# Patient Record
Sex: Male | Born: 1970 | Race: Black or African American | Hispanic: No | Marital: Married | Smoking: Never smoker
Health system: Southern US, Community
[De-identification: ages and names within clinical notes are randomized; demographics above are authoritative.]

## PROBLEM LIST (undated history)

## (undated) DIAGNOSIS — IMO0001 Reserved for inherently not codable concepts without codable children: Secondary | ICD-10-CM

## (undated) HISTORY — DX: Reserved for inherently not codable concepts without codable children: IMO0001

## (undated) HISTORY — PX: OTHER SURGICAL HISTORY: SHX169

## (undated) HISTORY — PX: BACK SURGERY: SHX140

---

## 2007-06-28 ENCOUNTER — Other Ambulatory Visit: Admission: RE | Admit: 2007-06-28 | Discharge: 2007-06-28 | Payer: Self-pay | Admitting: Otolaryngology

## 2007-07-08 ENCOUNTER — Encounter: Admission: RE | Admit: 2007-07-08 | Discharge: 2007-07-08 | Payer: Self-pay | Admitting: Otolaryngology

## 2007-08-01 ENCOUNTER — Other Ambulatory Visit: Admission: RE | Admit: 2007-08-01 | Discharge: 2007-08-01 | Payer: Self-pay | Admitting: Otolaryngology

## 2007-08-30 ENCOUNTER — Encounter: Payer: Self-pay | Admitting: Infectious Disease

## 2007-09-24 ENCOUNTER — Ambulatory Visit: Payer: Self-pay | Admitting: Infectious Diseases

## 2007-09-24 ENCOUNTER — Encounter: Payer: Self-pay | Admitting: Infectious Disease

## 2007-09-24 DIAGNOSIS — R599 Enlarged lymph nodes, unspecified: Secondary | ICD-10-CM | POA: Insufficient documentation

## 2007-09-24 LAB — CONVERTED CEMR LAB
Basophils Absolute: 0 10*3/uL (ref 0.0–0.1)
Eosinophils Relative: 1 % (ref 0–5)
HCT: 43.9 % (ref 39.0–52.0)
Hemoglobin: 14.6 g/dL (ref 13.0–17.0)
Lymphocytes Relative: 47 % — ABNORMAL HIGH (ref 12–46)
Lymphs Abs: 2 10*3/uL (ref 0.7–4.0)
Monocytes Absolute: 0.3 10*3/uL (ref 0.1–1.0)
Neutro Abs: 1.9 10*3/uL (ref 1.7–7.7)
Platelets: 237 10*3/uL (ref 150–400)
RDW: 12.4 % (ref 11.5–15.5)
Sed Rate: 9 mm/hr (ref 0–16)
WBC: 4.3 10*3/uL (ref 4.0–10.5)

## 2014-04-06 ENCOUNTER — Encounter (HOSPITAL_BASED_OUTPATIENT_CLINIC_OR_DEPARTMENT_OTHER): Payer: Self-pay | Admitting: Emergency Medicine

## 2014-04-06 ENCOUNTER — Emergency Department (HOSPITAL_BASED_OUTPATIENT_CLINIC_OR_DEPARTMENT_OTHER)
Admission: EM | Admit: 2014-04-06 | Discharge: 2014-04-07 | Disposition: A | Discharge status: Home / Self Care | Payer: Managed Care, Other (non HMO) | Attending: Emergency Medicine | Admitting: Emergency Medicine

## 2014-04-06 ENCOUNTER — Emergency Department (HOSPITAL_BASED_OUTPATIENT_CLINIC_OR_DEPARTMENT_OTHER): Payer: Managed Care, Other (non HMO)

## 2014-04-06 DIAGNOSIS — M25519 Pain in unspecified shoulder: Secondary | ICD-10-CM | POA: Insufficient documentation

## 2014-04-06 DIAGNOSIS — R079 Chest pain, unspecified: Secondary | ICD-10-CM | POA: Insufficient documentation

## 2014-04-06 LAB — TROPONIN I

## 2014-04-06 LAB — CBC
HEMATOCRIT: 41.9 % (ref 39.0–52.0)
HEMOGLOBIN: 14.6 g/dL (ref 13.0–17.0)
MCH: 30.9 pg (ref 26.0–34.0)
MCHC: 34.8 g/dL (ref 30.0–36.0)
MCV: 88.8 fL (ref 78.0–100.0)
Platelets: 201 10*3/uL (ref 150–400)
RBC: 4.72 MIL/uL (ref 4.22–5.81)
RDW: 11.8 % (ref 11.5–15.5)
WBC: 5.3 10*3/uL (ref 4.0–10.5)

## 2014-04-06 LAB — PROTIME-INR
INR: 1.04 (ref 0.00–1.49)
PROTHROMBIN TIME: 13.6 s (ref 11.6–15.2)

## 2014-04-06 LAB — COMPREHENSIVE METABOLIC PANEL
ALT: 24 U/L (ref 0–53)
AST: 26 U/L (ref 0–37)
Albumin: 4.3 g/dL (ref 3.5–5.2)
Alkaline Phosphatase: 81 U/L (ref 39–117)
BUN: 9 mg/dL (ref 6–23)
CALCIUM: 9.5 mg/dL (ref 8.4–10.5)
CO2: 26 mEq/L (ref 19–32)
CREATININE: 1.2 mg/dL (ref 0.50–1.35)
Chloride: 101 mEq/L (ref 96–112)
GFR calc non Af Amer: 74 mL/min — ABNORMAL LOW (ref >90)
GFR, EST AFRICAN AMERICAN: 85 mL/min — AB (ref >90)
GLUCOSE: 97 mg/dL (ref 70–99)
Potassium: 3.8 mEq/L (ref 3.7–5.3)
Sodium: 140 mEq/L (ref 137–147)
TOTAL PROTEIN: 7.8 g/dL (ref 6.0–8.3)
Total Bilirubin: 0.6 mg/dL (ref 0.3–1.2)

## 2014-04-06 LAB — APTT: aPTT: 28 seconds (ref 24–37)

## 2014-04-06 MED ORDER — ASPIRIN 81 MG PO CHEW
324.0000 mg | CHEWABLE_TABLET | Freq: Once | ORAL | Status: AC
  Administered 2014-04-06: 324 mg via ORAL
  Filled 2014-04-06: qty 4

## 2014-04-06 MED ORDER — SODIUM CHLORIDE 0.9 % IV SOLN
1000.0000 mL | INTRAVENOUS | Status: DC
  Administered 2014-04-06: 1000 mL via INTRAVENOUS

## 2014-04-06 NOTE — ED Notes (Signed)
Pt c/o left shoulder and lefty chest pain  X 1 hr

## 2014-04-06 NOTE — ED Notes (Signed)
Troponin sent to lab. Pt condition remains stable currently denies pain and is NSR on monitor.

## 2014-04-06 NOTE — ED Provider Notes (Signed)
CSN: [id number]     Arrival date & time 04/06/14  2010 History  This chart was scribed for Linwood DibblesJon Ayyub Krall, MD by Shari HeritageAisha Amuda, ED Scribe. The patient was seen in room MH02/MH02. Patient's care was started at 8:20 PM.   Chief Complaint  Patient presents with  . Chest Pain    The history is provided by the patient. No language interpreter was used.    HPI Comments: Laurence SlateJoe Seawright is a 43 y.o. male who presents to the Emergency Department complaining of an episode moderate, left anterior chest and left shoulder pain that began 1 hour ago and lasted about 20 minutes. Patient states that pain began while he was at rest at home. Pain does not radiate through to his back. Her reports that he lied down for several minutes and pain resolved spontaneously. He and his family came to the ED immediately following this for evaluation of this pain. Currently, he denies any discomfort. He states that he has been under a lot of stress recently after starting a new job. He denies shortness of breath, diaphoresis, vomiting, leg swelling, nausea, headaches, or any other symptoms at this time. He has no history of MI or cardiac disease and has no chronic medical conditions. He denies family history of cardiac disease. He states that he exercises 3-4 days per week. Patient does not smoke.  History reviewed. No pertinent past medical history. History reviewed. No pertinent past surgical history. History reviewed. No pertinent family history. History  Substance Use Topics  . Smoking status: Never Smoker   . Smokeless tobacco: Not on file  . Alcohol Use: No    Review of Systems  Constitutional: Negative for fever, chills and diaphoresis.  HENT: Negative for sore throat.   Eyes: Negative for visual disturbance.  Respiratory: Negative for cough and shortness of breath.   Cardiovascular: Positive for chest pain. Negative for leg swelling.  Gastrointestinal: Negative for nausea, vomiting, abdominal pain and diarrhea.   Genitourinary: Negative for difficulty urinating.  Musculoskeletal: Positive for myalgias (left shoulder pain). Negative for back pain.  Skin: Negative for rash.  Neurological: Negative for syncope, weakness, numbness and headaches.    Allergies  Review of patient's allergies indicates no known allergies.  Home Medications   Prior to Admission medications   Not on File   Triage Vitals: BP 170/86  Pulse 62  Temp(Src) 98.5 F (36.9 C) (Oral)  Resp 16  Ht 5\' 9"  (1.753 m)  Wt 172 lb (78.019 kg)  BMI 25.39 kg/m2  SpO2 100%  Physical Exam  Nursing note and vitals reviewed. Constitutional: He appears well-developed and well-nourished. No distress.  HENT:  Head: Normocephalic and atraumatic.  Right Ear: External ear normal.  Left Ear: External ear normal.  Eyes: Conjunctivae are normal. Right eye exhibits no discharge. Left eye exhibits no discharge. No scleral icterus.  Neck: Neck supple. No tracheal deviation present.  Cardiovascular: Normal rate, regular rhythm and intact distal pulses.   Pulmonary/Chest: Effort normal and breath sounds normal. No stridor. No respiratory distress. He has no wheezes. He has no rales.  Abdominal: Soft. Bowel sounds are normal. He exhibits no distension. There is no tenderness. There is no rebound and no guarding.  Musculoskeletal: He exhibits no edema and no tenderness.  Neurological: He is alert. He has normal strength. No cranial nerve deficit (no facial droop, extraocular movements intact, no slurred speech) or sensory deficit. He exhibits normal muscle tone. He displays no seizure activity. Coordination normal.  Skin: Skin is warm  and dry. No rash noted.  Psychiatric: He has a normal mood and affect.    ED Course  Procedures (including critical care time) DIAGNOSTIC STUDIES: Oxygen Saturation is 100% on room air, normal by my interpretation.    COORDINATION OF CARE: 8:22 PM- Patient informed of current plan for treatment and evaluation  and agrees with plan at this time.    Labs Review Results for orders placed during the hospital encounter of 04/06/14  APTT      Result Value Ref Range   aPTT 28  24 - 37 seconds  CBC      Result Value Ref Range   WBC 5.3  4.0 - 10.5 K/uL   RBC 4.72  4.22 - 5.81 MIL/uL   Hemoglobin 14.6  13.0 - 17.0 g/dL   HCT 40.9  81.1 - 91.4 %   MCV 88.8  78.0 - 100.0 fL   MCH 30.9  26.0 - 34.0 pg   MCHC 34.8  30.0 - 36.0 g/dL   RDW 78.2  95.6 - 21.3 %   Platelets 201  150 - 400 K/uL  COMPREHENSIVE METABOLIC PANEL      Result Value Ref Range   Sodium 140  137 - 147 mEq/L   Potassium 3.8  3.7 - 5.3 mEq/L   Chloride 101  96 - 112 mEq/L   CO2 26  19 - 32 mEq/L   Glucose, Bld 97  70 - 99 mg/dL   BUN 9  6 - 23 mg/dL   Creatinine, Ser 0.86  0.50 - 1.35 mg/dL   Calcium 9.5  8.4 - 57.8 mg/dL   Total Protein 7.8  6.0 - 8.3 g/dL   Albumin 4.3  3.5 - 5.2 g/dL   AST 26  0 - 37 U/L   ALT 24  0 - 53 U/L   Alkaline Phosphatase 81  39 - 117 U/L   Total Bilirubin 0.6  0.3 - 1.2 mg/dL   GFR calc non Af Amer 74 (*) >90 mL/min   GFR calc Af Amer 85 (*) >90 mL/min  PROTIME-INR      Result Value Ref Range   Prothrombin Time 13.6  11.6 - 15.2 seconds   INR 1.04  0.00 - 1.49  TROPONIN I      Result Value Ref Range   Troponin I <0.30  <0.30 ng/mL  TROPONIN I      Result Value Ref Range   Troponin I <0.30  <0.30 ng/mL    Imaging Review Dg Chest 2 View  04/06/2014   CLINICAL DATA:  Chest Pain  EXAM: CHEST  2 VIEW  COMPARISON:  07/08/2007  FINDINGS: The heart size and mediastinal contours are within normal limits. Both lungs are clear. The visualized skeletal structures are unremarkable.  IMPRESSION: No active cardiopulmonary disease.   Electronically Signed   By: Elige Ko   On: 04/06/2014 21:15     EKG Interpretation   Date/Time:  Monday April 06 2014 20:22:05 EDT Ventricular Rate:  81 PR Interval:  214 QRS Duration: 86 QT Interval:  370 QTC Calculation: 429 R Axis:   45 Text Interpretation:   Sinus rhythm with sinus arrhythmia with 1st degree  A-V block Otherwise normal ECG No previous tracing Confirmed by Adryan Shin   MD-J, Tresea Heine ([id number]) on 04/06/2014 8:34:15 PM      MDM   Final diagnoses:  Chest pain, unspecified chest pain type    Pt presented with a brief episode of chest pain.  ECG reassuring.  2 sets of cardiac enzymes are normal.  TIMI 0.  Pt exercises regularly.  Will have him follow up with PCP.  Consider outpatient stress test  I personally performed the services described in this documentation, which was scribed in my presence.  The recorded information has been reviewed and is accurate.    Linwood DibblesJon Mkenzie Dotts, MD 04/06/14 [id number]

## 2014-04-06 NOTE — Discharge Instructions (Signed)

## 2015-10-16 ENCOUNTER — Emergency Department (HOSPITAL_BASED_OUTPATIENT_CLINIC_OR_DEPARTMENT_OTHER)
Admission: EM | Admit: 2015-10-16 | Discharge: 2015-10-16 | Disposition: A | Discharge status: Home / Self Care | Payer: Managed Care, Other (non HMO) | Attending: Emergency Medicine | Admitting: Emergency Medicine

## 2015-10-16 ENCOUNTER — Encounter (HOSPITAL_BASED_OUTPATIENT_CLINIC_OR_DEPARTMENT_OTHER): Payer: Self-pay | Admitting: Emergency Medicine

## 2015-10-16 ENCOUNTER — Emergency Department (HOSPITAL_BASED_OUTPATIENT_CLINIC_OR_DEPARTMENT_OTHER): Payer: Managed Care, Other (non HMO)

## 2015-10-16 DIAGNOSIS — M25512 Pain in left shoulder: Secondary | ICD-10-CM | POA: Diagnosis present

## 2015-10-16 LAB — CBC WITH DIFFERENTIAL/PLATELET
Basophils Absolute: 0 K/uL (ref 0.0–0.1)
Basophils Relative: 0 %
Eosinophils Absolute: 0 K/uL (ref 0.0–0.7)
Eosinophils Relative: 0 %
HCT: 41.1 % (ref 39.0–52.0)
Hemoglobin: 14.3 g/dL (ref 13.0–17.0)
Lymphocytes Relative: 16 %
Lymphs Abs: 1.1 K/uL (ref 0.7–4.0)
MCH: 30.5 pg (ref 26.0–34.0)
MCHC: 34.8 g/dL (ref 30.0–36.0)
MCV: 87.6 fL (ref 78.0–100.0)
Monocytes Absolute: 0.4 K/uL (ref 0.1–1.0)
Monocytes Relative: 6 %
Neutro Abs: 5.4 K/uL (ref 1.7–7.7)
Neutrophils Relative %: 78 %
Platelets: 209 K/uL (ref 150–400)
RBC: 4.69 MIL/uL (ref 4.22–5.81)
RDW: 12 % (ref 11.5–15.5)
WBC: 6.9 K/uL (ref 4.0–10.5)

## 2015-10-16 LAB — COMPREHENSIVE METABOLIC PANEL
ALBUMIN: 4.4 g/dL (ref 3.5–5.0)
ALT: 38 U/L (ref 17–63)
ANION GAP: 4 — AB (ref 5–15)
AST: 30 U/L (ref 15–41)
Alkaline Phosphatase: 85 U/L (ref 38–126)
BILIRUBIN TOTAL: 0.8 mg/dL (ref 0.3–1.2)
BUN: 11 mg/dL (ref 6–20)
CO2: 25 mmol/L (ref 22–32)
Calcium: 8.8 mg/dL — ABNORMAL LOW (ref 8.9–10.3)
Chloride: 105 mmol/L (ref 101–111)
Creatinine, Ser: 1.15 mg/dL (ref 0.61–1.24)
GFR calc Af Amer: >60 mL/min (ref >60)
GFR calc non Af Amer: >60 mL/min (ref >60)
GLUCOSE: 115 mg/dL — AB (ref 65–99)
POTASSIUM: 3.8 mmol/L (ref 3.5–5.1)
SODIUM: 134 mmol/L — AB (ref 135–145)
Total Protein: 7.8 g/dL (ref 6.5–8.1)

## 2015-10-16 LAB — TROPONIN I: Troponin I: <0.03 ng/mL

## 2015-10-16 NOTE — ED Notes (Signed)
Pt in c/o L sided shoulder tightness onset today, also noticed bilateral tingling to feet yesterday. Denies SOB, nausea, lightheadedness.

## 2015-10-16 NOTE — ED Provider Notes (Addendum)
CSN: [id number]     Arrival date & time 10/16/15  1638 History   First MD Initiated Contact with Patient 10/16/15 1647     Chief Complaint  Patient presents with  . Shoulder Pain     (Consider location/radiation/quality/duration/timing/severity/associated sxs/prior Treatment) Patient is a 45 y.o. male presenting with shoulder pain. The history is provided by the patient.  Shoulder Pain Location:  Shoulder Time since incident:  3 hours Injury: no   Shoulder location:  L shoulder Pain details:    Quality:  Aching and pressure   Radiates to:  Does not radiate   Severity:  Mild   Onset quality:  Sudden   Timing:  Constant   Progression:  Unchanged Chronicity:  Recurrent Handedness:  Right-handed Prior injury to area:  No Relieved by: improved with aspirin and rest but not made worse by pullups, sprinting or push ups. Exacerbated by: nothing. Associated symptoms: no back pain, no decreased range of motion, no fever, no muscle weakness, no neck pain, no numbness, no swelling and no tingling   Risk factors comment:  No family hx of heart disease.  no medical problems but last time saw a pcp was 4 years ago   History reviewed. No pertinent past medical history. History reviewed. No pertinent past surgical history. History reviewed. No pertinent family history. Social History  Substance Use Topics  . Smoking status: Never Smoker   . Smokeless tobacco: None  . Alcohol Use: No    Review of Systems  Constitutional: Negative for fever.  Musculoskeletal: Negative for back pain and neck pain.  All other systems reviewed and are negative.     Allergies  Review of patient's allergies indicates no known allergies.  Home Medications   Prior to Admission medications   Not on File   BP 174/100 mmHg  Pulse 98  Temp(Src) 98.4 F (36.9 C) (Oral)  Resp 18  Ht 5\' 9"  (1.753 m)  SpO2 96% Physical Exam  Constitutional: He is oriented to person, place, and time. He appears  well-developed and well-nourished. No distress.  HENT:  Head: Normocephalic and atraumatic.  Mouth/Throat: Oropharynx is clear and moist.  Eyes: Conjunctivae and EOM are normal. Pupils are equal, round, and reactive to light.  Neck: Normal range of motion. Neck supple.  Cardiovascular: Normal rate, regular rhythm and intact distal pulses.   No murmur heard. Pulmonary/Chest: Effort normal and breath sounds normal. No respiratory distress. He has no wheezes. He has no rales. He exhibits no tenderness.  Abdominal: Soft. He exhibits no distension. There is no tenderness. There is no rebound and no guarding.  Musculoskeletal: Normal range of motion. He exhibits tenderness. He exhibits no edema.       Arms: No swelling noted of the left upper ext.  No calf pain or tenderness.  Pain with shoulder abduction and cross reaching  Neurological: He is alert and oriented to person, place, and time.  Skin: Skin is warm and dry. No rash noted. No erythema.  Psychiatric: He has a normal mood and affect. His behavior is normal.  Nursing note and vitals reviewed.   ED Course  Procedures (including critical care time) Labs Review Labs Reviewed  COMPREHENSIVE METABOLIC PANEL - Abnormal; Notable for the following:    Sodium 134 (*)    Glucose, Bld 115 (*)    Calcium 8.8 (*)    Anion gap 4 (*)    All other components within normal limits  CBC WITH DIFFERENTIAL/PLATELET  TROPONIN I  Imaging Review Dg Chest 2 View  10/16/2015  CLINICAL DATA:  Left shoulder tightness onset today. Bilateral tingling to feet yesterday. EXAM: CHEST  2 VIEW COMPARISON:  Chest x-ray dated 04/06/2014. FINDINGS: The heart size and mediastinal contours are within normal limits. Both lungs are clear. The visualized skeletal structures are unremarkable. IMPRESSION: Stable chest x-ray. No evidence of acute cardiopulmonary abnormality. Electronically Signed   By: Bary RichardStan  Maynard M.D.   On: 10/16/2015 17:36   Dg Shoulder  Left  10/16/2015  CLINICAL DATA:  Left-sided shoulders tightness onset today. EXAM: LEFT SHOULDER - 2+ VIEW COMPARISON:  None. FINDINGS: There is no evidence of fracture or dislocation. Bone mineralization is normal. No focal cortical irregularity or osseous lesion. There is no evidence of arthropathy. Adjacent soft tissues are unremarkable. IMPRESSION: Negative. Electronically Signed   By: Bary RichardStan  Maynard M.D.   On: 10/16/2015 17:37   I have personally reviewed and evaluated these images and lab results as part of my medical decision-making.   EKG Interpretation   Date/Time:  Saturday October 16 2015 16:49:33 EST Ventricular Rate:  99 PR Interval:  218 QRS Duration: 98 QT Interval:  331 QTC Calculation: 425 R Axis:   45 Text Interpretation:  Sinus rhythm Prolonged PR interval Borderline T wave  abnormalities Artifact Confirmed by Anitra LauthPLUNKETT  MD, Alphonzo LemmingsWHITNEY ([id number]) on  10/16/2015 5:01:19 PM      MDM   Final diagnoses:  Shoulder pain, left    Pt with atypical story for CP with left shoulder achiness without SOB or worsening with sprinting or pull ups.  Heart score of 1.  PERC neg.  EKG with nonspecific T-wave inversion in lead 3 without other acute findings.  Patient's pulses are equal in all extremities and low suspicion that this is an aortic dissection.  No GI component. No swelling of the arm or concern for DVT as he said no procedures done and does not have a clotting history. Patient has had pain like this intermittently for some time and it started after he was out in the snow with his kids. CXR, CBC, CMP, trop (being drawn 3 hours after pain and pt currently still having pain) pending.  Repeat BP improved to 141/83 without intervention.  5:47 PM Labs and x-rays wnl.  Low suspicion that this is cardiac.  Pt d/ced home to f/u with PCP.   Gwyneth SproutWhitney Taeden Geller, MD 10/16/15 [id number]  Gwyneth SproutWhitney Kazzandra Desaulniers, MD 10/16/15 1755  Gwyneth SproutWhitney Lashannon Bresnan, MD 10/16/15 [id number]

## 2015-12-02 DIAGNOSIS — R739 Hyperglycemia, unspecified: Secondary | ICD-10-CM | POA: Insufficient documentation

## 2015-12-05 DIAGNOSIS — F419 Anxiety disorder, unspecified: Secondary | ICD-10-CM | POA: Insufficient documentation

## 2015-12-08 ENCOUNTER — Ambulatory Visit (INDEPENDENT_AMBULATORY_CARE_PROVIDER_SITE_OTHER): Payer: Managed Care, Other (non HMO) | Admitting: Family Medicine

## 2015-12-08 ENCOUNTER — Encounter: Payer: Self-pay | Admitting: Family Medicine

## 2015-12-08 VITALS — BP 134/84 | HR 94 | Ht 68.5 in | Wt 188.0 lb

## 2015-12-08 DIAGNOSIS — Z7689 Persons encountering health services in other specified circumstances: Secondary | ICD-10-CM

## 2015-12-08 DIAGNOSIS — F411 Generalized anxiety disorder: Secondary | ICD-10-CM

## 2015-12-08 DIAGNOSIS — Z7189 Other specified counseling: Secondary | ICD-10-CM

## 2015-12-08 NOTE — Patient Instructions (Signed)
It was great to see you today. Let me know if you do want to try any medications for anxiety-  I am glad to call something in for you if you like.

## 2015-12-08 NOTE — Progress Notes (Signed)
[facility] [address] [place], [place] [address] [phone]  Date:  12/08/2015   Name:  Tanner Sanchez   DOB:  Nov 02, 1970   MRN:  [id number]  PCP:  Abbe Amsterdam, MD    Chief Complaint: Establish Care   History of Present Illness:  Tanner Sanchez is a 45 y.o. very pleasant male patient who presents with the following:  Here today as a new pt to establish care.  Also, he was in the ER on 10/16/2015 or so with left shoulder pain- this turned out to be MSK in origin.  He also was seen at an UC about a month after that for similar sx.  He reports that everything checked out ok as far as his heart.  He admits he may have a little anxiety that contributed to his symptoms.  He is working on his stress levels with meditation, herbs, relaxation techniques, etc.    Medical history: none Surgical:he did have an infected lymph node removed, and back surgery years ago.   Social: he is married, they have 2 children (3 and 6).  They are in the process of building a new home and are currently in an apt.  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   They are moving to [place] to be closer to relatives Never a smoker, no alcohol  He does enjoy exercising about 6 days a week. He runs, does interval training and light weights.    He denies any sx of depression. Discussed his anxiety and possible medication options.  For now he does not desire any medication but will keep this in mind    Patient Active Problem List   Diagnosis Date Noted  . CERVICAL LYMPHADENOPATHY, ANTERIOR, RIGHT 09/24/2007    No past medical history on file.  No past surgical history on file.  Social History  Substance Use Topics  . Smoking status: Never Smoker   . Smokeless tobacco: None  . Alcohol Use: No    Family History  Problem Relation Age of Onset  . Diabetes Paternal Grandmother   .  Alcohol abuse Other   . Drug abuse Other     No Known Allergies  Medication list has been reviewed and updated.  No current outpatient prescriptions on file prior to visit.   No current facility-administered medications on file prior to visit.    Review of Systems:  As per HPI- otherwise negative.   Physical Examination: Filed Vitals:   12/08/15 1013  BP: 134/84  Pulse: 94   Filed Vitals:   12/08/15 1013  Height: 5' 8.5" (1.74 m)  Weight: 188 lb (85.276 kg)   Body mass index is 28.17 kg/(m^2). Ideal Body Weight: Weight in (lb) to have BMI = 25: 166.5  GEN: WDWN, NAD, Non-toxic, A & O x 3, appears healthy and fit HEENT: Atraumatic, Normocephalic. Neck supple. No masses, No LAD. Ears and Nose: No external deformity. CV: RRR, No M/G/R. No JVD. No thrill. No extra heart sounds. PULM: CTA B, no wheezes, crackles, rhonchi. No retractions. No resp. distress. No accessory muscle use. EXTR: No c/c/e NEURO Normal gait.  PSYCH: Normally interactive. Conversant. Not depressed or anxious appearing.  Calm demeanor.    Assessment and Plan: Establishing care with new doctor, encounter for  GAD (generalized anxiety disorder)  Generally healthy gentleman here today to establish care and also to  discuss his anxiety.  He has scheduled an appt with a counselor which I certainly encourage.  His worries mostly concern his health and also finances. He seems most interested in using herbs to manage his sx.  Advised that I do not have formal training in herbals- he may want to consult a herbalist- but I did print out a hand out regarding herbs for anxiety from [facility] He has had recent labs and does not desire labs today He will let me know if he does decide to pursue medication therapy for his sx  Signed Abbe Amsterdam, MD

## 2016-01-04 ENCOUNTER — Encounter: Payer: Self-pay | Admitting: Family Medicine

## 2016-01-04 ENCOUNTER — Ambulatory Visit (INDEPENDENT_AMBULATORY_CARE_PROVIDER_SITE_OTHER): Payer: Managed Care, Other (non HMO) | Admitting: Family Medicine

## 2016-01-04 VITALS — BP 130/84 | HR 79 | Ht 69.0 in | Wt 182.0 lb

## 2016-01-04 DIAGNOSIS — S29011A Strain of muscle and tendon of front wall of thorax, initial encounter: Secondary | ICD-10-CM

## 2016-01-04 NOTE — Patient Instructions (Signed)
You have a pec major strain. Physical therapy is the most important part of your treatment with considering iontophoresis as well. Do home exercises on days you don't go to therapy. Heat most of the time 15 minutes at a time 3-4 times a day - icing after exercises and PT. Ibuprofen or tylenol only if needed. Follow up with me in 6 weeks for reevaluation.

## 2016-01-05 DIAGNOSIS — S29011A Strain of muscle and tendon of front wall of thorax, initial encounter: Secondary | ICD-10-CM | POA: Insufficient documentation

## 2016-01-05 NOTE — Assessment & Plan Note (Signed)
Reassured patient.  Shoulder exam otherwise normal and no red flag symptoms.  Start with physical therapy, home exercises, heat, nsaids/tylenol if needed.  F/u in 6 weeks.

## 2016-01-05 NOTE — Progress Notes (Signed)
PCP: COPLAND,JESSICA, MD  Subjective:   HPI: Patient is a 45 y.o. male here for left shoulder pain.  Patient reports for over 1 1/2 years he has had intermittent pain in left chest wall. Seemed to start after reaching backwards to get a rebound playing basketball. Pain gets up to 4/10. Can be associated with burning sensation locally in chest. Tried stim at home which helped some. Is right handed. No skin changes, fever, sweating, shortness of breath.  No past medical history on file.  No current outpatient prescriptions on file prior to visit.   No current facility-administered medications on file prior to visit.    No past surgical history on file.  No Known Allergies  Social History   Social History  . Marital Status: Married    Spouse Name: N/A  . Number of Children: N/A  . Years of Education: N/A   Occupational History  . Not on file.   Social History Main Topics  . Smoking status: Never Smoker   . Smokeless tobacco: Not on file  . Alcohol Use: No  . Drug Use: No  . Sexual Activity: No   Other Topics Concern  . Not on file   Social History Narrative    Family History  Problem Relation Age of Onset  . Diabetes Paternal Grandmother   . Alcohol abuse Other   . Drug abuse Other     BP 130/84 mmHg  Pulse 79  Ht 5\' 9"  (1.753 m)  Wt 182 lb (82.555 kg)  BMI 26.86 kg/m2  Review of Systems: See HPI above.    Objective:  Physical Exam:  Gen: NAD, comfortable in exam room  Left shoulder/chest: No swelling, ecchymoses.  No gross deformity. Mild TTP pec major.  No tenderness or defect of axillary folds. FROM without pain currently including with resisted bench press motion and fly motion. Negative Hawkins, Neers. Negative Speeds, Yergasons. Strength 5/5 with empty can and resisted internal/external rotation. Negative apprehension. NV intact distally.    Assessment & Plan:  1. Left pectoralis major strain - Reassured patient.  Shoulder exam  otherwise normal and no red flag symptoms.  Start with physical therapy, home exercises, heat, nsaids/tylenol if needed.  F/u in 6 weeks.

## 2016-01-25 ENCOUNTER — Telehealth (HOSPITAL_BASED_OUTPATIENT_CLINIC_OR_DEPARTMENT_OTHER): Payer: Self-pay | Admitting: Emergency Medicine

## 2016-01-25 NOTE — ED Notes (Signed)
Pt appeared in person and spoke with this RN requesting medical records for continuity of care due to upcoming appt. Instructed pt that only paper work I could provide would be copy of his d/c instructions from january and patient could log onto my chart, access previous visit information and diagnostic information that route. Originally accessed information from [employer] computer, however, I logged out, logged back in and information was present, AVS printed and handed to patient. I was only RN who reviewed information and patient had no further questions

## 2016-02-21 ENCOUNTER — Encounter: Payer: Self-pay | Admitting: Family Medicine

## 2016-02-21 ENCOUNTER — Ambulatory Visit (INDEPENDENT_AMBULATORY_CARE_PROVIDER_SITE_OTHER): Payer: Managed Care, Other (non HMO) | Admitting: Family Medicine

## 2016-02-21 VITALS — BP 148/88 | HR 69 | Ht 69.0 in | Wt 175.0 lb

## 2016-02-21 DIAGNOSIS — S29011D Strain of muscle and tendon of front wall of thorax, subsequent encounter: Secondary | ICD-10-CM | POA: Diagnosis not present

## 2016-02-21 NOTE — Patient Instructions (Signed)
I would continue with the home exercises most days of the week for 4 more weeks. Follow up with me in 6 weeks only if you're having problems - would consider neck imaging at that time though your distribution suggests this is muscular (primarily of the trapezius).

## 2016-02-22 NOTE — Assessment & Plan Note (Signed)
Reassured patient.  Much improved with physical therapy, home exercises.  Some trapezius spasms/pain now.  Continue home exercises for 4 more weeks.  F/u with us if not improving in 6 weeks.  Heat, nsaids/tylenol if needed.

## 2016-02-22 NOTE — Progress Notes (Signed)
PCP: COPLAND,JESSICA, MD  Subjective:   HPI: Patient is a 45 y.o. male here for left shoulder pain.  3/28: Patient reports for over 1 1/2 years he has had intermittent pain in left chest wall. Seemed to start after reaching backwards to get a rebound playing basketball. Pain gets up to 4/10. Can be associated with burning sensation locally in chest. Tried stim at home which helped some. Is right handed. No skin changes, fever, sweating, shortness of breath.  5/15: Patient reports he feels much better. Having some pain superior left shoulder now but this is new and unrelated to prior issue. Doing well with physical therapy and home exercises. No chest wall pain, burning. No skin changes, fever, numbness. Pain level max is 3/10 superior left shoulder.  No past medical history on file.  No current outpatient prescriptions on file prior to visit.   No current facility-administered medications on file prior to visit.    No past surgical history on file.  No Known Allergies  Social History   Social History  . Marital Status: Married    Spouse Name: N/A  . Number of Children: N/A  . Years of Education: N/A   Occupational History  . Not on file.   Social History Main Topics  . Smoking status: Never Smoker   . Smokeless tobacco: Not on file  . Alcohol Use: No  . Drug Use: No  . Sexual Activity: No   Other Topics Concern  . Not on file   Social History Narrative    Family History  Problem Relation Age of Onset  . Diabetes Paternal Grandmother   . Alcohol abuse Other   . Drug abuse Other     BP 148/88 mmHg  Pulse 69  Ht 5\' 9"  (1.753 m)  Wt 175 lb (79.379 kg)  BMI 25.83 kg/m2  Review of Systems: See HPI above.    Objective:  Physical Exam:  Gen: NAD, comfortable in exam room  Left shoulder/chest: No swelling, ecchymoses.  No gross deformity. Mild TTP left trapezius only now.  No tenderness or defect of axillary folds. FROM without pain. Negative  Hawkins, Neers. Negative Speeds, Yergasons. Strength 5/5 with empty can and resisted internal/external rotation. Negative apprehension. NV intact distally.    Assessment & Plan:  1. Left pectoralis major strain - Reassured patient.  Much improved with physical therapy, home exercises.  Some trapezius spasms/pain now.  Continue home exercises for 4 more weeks.  F/u with us if not improving in 6 weeks.  Heat, nsaids/tylenol if needed.

## 2016-02-23 ENCOUNTER — Encounter: Payer: Self-pay | Admitting: Family Medicine

## 2016-02-23 ENCOUNTER — Ambulatory Visit (INDEPENDENT_AMBULATORY_CARE_PROVIDER_SITE_OTHER): Payer: Managed Care, Other (non HMO) | Admitting: Family Medicine

## 2016-02-23 VITALS — BP 145/95 | HR 83 | Temp 97.8°F | Ht 69.0 in | Wt 179.0 lb

## 2016-02-23 DIAGNOSIS — R202 Paresthesia of skin: Secondary | ICD-10-CM

## 2016-02-23 DIAGNOSIS — E270 Other adrenocortical overactivity: Secondary | ICD-10-CM

## 2016-02-23 DIAGNOSIS — R7989 Other specified abnormal findings of blood chemistry: Secondary | ICD-10-CM

## 2016-02-23 MED ORDER — PROPRANOLOL HCL 10 MG PO TABS
10.0000 mg | ORAL_TABLET | Freq: Three times a day (TID) | ORAL | Status: DC
Start: 2016-02-23 — End: 2016-03-17

## 2016-02-23 NOTE — Progress Notes (Signed)
[facility] [address], [place], [place] [address] [phone]  Date:  02/23/2016   Name:  Tanner Sanchez   DOB:  21-Feb-1971   MRN:  [id number]  PCP:  Lamar Blinks, MD    Chief Complaint: Anxiety   History of Present Illness:  Tanner Sanchez is a 45 y.o. very pleasant male patient who presents with the following:  We first met in March- he had complaint of anxiety.  He is here today with complaint of a feeling of being intermittently feeling hot and tinlgy all over his body, and itchy He will notice pains in his chest and trunk that come and go He may feel dizzy at times  He feels like he might have a pheochromcytoma, he is also concerned about MS or ALS.  He does not really believe that his sx could be anxiety.    He was seen by an integrative medicine doctor who did a lot of labs - he has these with him today.  All are normal except for a saliva cortisol test  He does not have a family history of heart disease.  He is running several times a week and does not have any CP with exercise.  No SOB He notes his sx more when he is at rest, like when he lays down in bed at night He has been trying to lose weight and get in shape  Wt Readings from Last 3 Encounters:  02/23/16 179 lb (81.194 kg)  02/21/16 175 lb (79.379 kg)  01/04/16 182 lb (82.555 kg)   He is doing some CBT and is taking vitamins.  He has a lot of fears about taking any rx medications His BP is usually under control  Patient Active Problem List   Diagnosis Date Noted  . Strain of left pectoralis muscle 01/05/2016  . Anxiety 12/05/2015  . Elevated blood sugar 12/02/2015  . CERVICAL LYMPHADENOPATHY, ANTERIOR, RIGHT 09/24/2007    No past medical history on file.  No past surgical history on file.  Social History  Substance Use Topics  . Smoking status: Never Smoker   . Smokeless tobacco: None  . Alcohol Use: No    Family History  Problem  Relation Age of Onset  . Diabetes Paternal Grandmother   . Alcohol abuse Other   . Drug abuse Other     No Known Allergies  Medication list has been reviewed and updated.  No current outpatient prescriptions on file prior to visit.   No current facility-administered medications on file prior to visit.    Review of Systems:  As per HPI- otherwise negative.   Physical Examination: Filed Vitals:   02/23/16 1539  BP: 164/88  Pulse: 83  Temp: 97.8 F (36.6 C)   Filed Vitals:   02/23/16 1539  Height: 5' 9"  (1.753 m)  Weight: 179 lb (81.194 kg)   Body mass index is 26.42 kg/(m^2). Ideal Body Weight: Weight in (lb) to have BMI = 25: 168.9  GEN: WDWN, NAD, Non-toxic, A & O x 3, looks well HEENT: Atraumatic, Normocephalic. Neck supple. No masses, No LAD.  Bilateral TM wnl, oropharynx normal.  PEERL,EOMI.   Ears and Nose: No external deformity. CV: RRR, No M/G/R. No JVD. No thrill. No extra heart sounds. PULM: CTA B, no wheezes, crackles, rhonchi. No retractions. No resp. distress. No accessory muscle use. ABD: S, NT, ND EXTR: No c/c/e NEURO Normal gait.  PSYCH: Normally interactive. Conversant. Not  depressed or anxious appearing.  Calm demeanor.    Assessment and Plan: Tingling in extremities - Plan: propranolol (INDERAL) 10 MG tablet  Elevated cortisol level (HCC) - Plan: Ambulatory referral to Endocrinology  Here today to discuss concerns about several non- specific symptoms.  He is concerned that he could have one of several rather rare conditions.  I am not sure what to make of his saliva cortisol results - will refer to endocrinology for further eval. He is willing to try taking a low dose of inderal as needed for his sx of dizziness and tingling which I suspect are related to anxiety.  He will let me know how this works for him Follow-up pending his endocrinology appt  Meds ordered this encounter  Medications  . Cholecalciferol (VITAMIN D-3 PO)    Sig: Take  5,000 Units by mouth.  . FOLIC EQAS-T4-H96-QIWLNLGX Q10 PO    Sig: Take by mouth daily.  Marland Kitchen VITAMIN K PO    Sig: Take 150 mcg by mouth daily.  . propranolol (INDERAL) 10 MG tablet    Sig: Take 1 tablet (10 mg total) by mouth 3 (three) times daily. Use as needed for palpitations and other symptoms of anxiety    Dispense:  40 tablet    Refill:  1     Signed Lamar Blinks, MD

## 2016-02-23 NOTE — Patient Instructions (Signed)
I will refer you to endocrinology to ensure that you do not need any further evaluation of your cortisol Try the propranolol as needed- 10 mg up to three times a day when needed for feeling of tingling, hot, etc. Let me know how this does for you!   If your endocrinology work- up is negative and symptoms persist we can look further

## 2016-02-23 NOTE — Progress Notes (Signed)
Pre visit review using our clinic tool,if applicable. No additional management support is needed unless otherwise documented below in the visit note.  

## 2016-02-28 ENCOUNTER — Ambulatory Visit (INDEPENDENT_AMBULATORY_CARE_PROVIDER_SITE_OTHER): Payer: Managed Care, Other (non HMO) | Admitting: Endocrinology

## 2016-02-28 ENCOUNTER — Encounter: Payer: Self-pay | Admitting: Endocrinology

## 2016-02-28 VITALS — BP 144/86 | HR 69 | Temp 98.1°F | Ht 69.0 in | Wt 179.0 lb

## 2016-02-28 DIAGNOSIS — E279 Disorder of adrenal gland, unspecified: Secondary | ICD-10-CM | POA: Diagnosis not present

## 2016-02-28 MED ORDER — DEXAMETHASONE 1 MG PO TABS
ORAL_TABLET | ORAL | Status: DC
Start: 2016-02-28 — End: 2016-03-06

## 2016-02-28 NOTE — Patient Instructions (Addendum)
you should do a "dexamethasone suppression test."  for this, you would take dexamethasone 1 mg at 10 pm, then come in for a "cortisol" blood test the next morning before 9 am.  you do not need to be fasting for this test.  Also, let's check 2 24-HR urine tests.   I would be happy to see you back here as necessary.

## 2016-02-28 NOTE — Progress Notes (Signed)
Subjective:    Patient ID: Tanner Sanchez, male    DOB: 23-Mar-1971, 45 y.o.   MRN: [id number]  HPI Pt states few months of intermittent moderate tingling sensation throughout the body, and assoc anxiety.  He is unable to cite precip factor.  He was also recently noted to have HTN. He was recently noted to have elevated salivary cortisol.   No past medical history on file.  No past surgical history on file.  Social History   Social History  . Marital Status: Married    Spouse Name: N/A  . Number of Children: N/A  . Years of Education: N/A   Occupational History  . Not on file.   Social History Main Topics  . Smoking status: Never Smoker   . Smokeless tobacco: Not on file  . Alcohol Use: No  . Drug Use: No  . Sexual Activity: No   Other Topics Concern  . Not on file   Social History Narrative    Current Outpatient Prescriptions on File Prior to Visit  Medication Sig Dispense Refill  . Cholecalciferol (VITAMIN D-3 PO) Take 5,000 Units by mouth.    . FOLIC ACID-B6-B12-COENZYME W09 PO Take by mouth daily.    . propranolol (INDERAL) 10 MG tablet Take 1 tablet (10 mg total) by mouth 3 (three) times daily. Use as needed for palpitations and other symptoms of anxiety 40 tablet 1  . VITAMIN K PO Take 150 mcg by mouth daily.     No current facility-administered medications on file prior to visit.    No Known Allergies  Family History  Problem Relation Age of Onset  . Diabetes Paternal Grandmother   . Alcohol abuse Other   . Drug abuse Other   . Adrenal disorder Neg Hx     BP 144/86 mmHg  Pulse 69  Temp(Src) 98.1 F (36.7 C) (Oral)  Ht  (1.753 m)  Wt 179 lb (81.194 kg)  BMI 26.42 kg/m2  SpO2 98%  Review of Systems denies headache, hair loss, excessive diaphoresis, polyuria, heat intolerance, sob, cramps, numbness, easy bruising, depression, and rash on the abdomen. He has lost weight, due to his efforts.      Objective:   Physical Exam VS: see vs page GEN:  no distress HEAD: head: no deformity eyes: no periorbital swelling, no proptosis external nose and ears are normal mouth: no lesion seen NECK: supple, thyroid is not enlarged CHEST WALL: no deformity LUNGS: clear to auscultation CV: reg rate and rhythm, no murmur ABD: abdomen is soft, nontender.  no hepatosplenomegaly.  not distended.  no hernia MUSCULOSKELETAL: muscle bulk and strength are grossly normal.  no obvious joint swelling.  gait is normal and steady EXTEMITIES: no deformity.  no ulcer on the feet.  feet are of normal color and temp.  no edema PULSES: dorsalis pedis intact bilat.  no carotid bruit NEURO:  cn 2-12 grossly intact.   readily moves all 4's.  sensation is intact to touch on the feet SKIN:  Normal texture and temperature.  No rash or suspicious lesion is visible.   NODES:  None palpable at the neck PSYCH: alert, well-oriented.  Does not appear anxious nor depressed.   i personally reviewed electrocardiogram tracing (10/16/15): Indication: shoulder pain.  Impression: long PR  I have reviewed outside records, and summarized: Pt was noted to have elevated salivary cortisol, and referred here.  He reported other nonspecific sxs.  He also reported anxiety. He was rx'ed with inderal.  Assessment & Plan:  Elevated salivary cortisol, new. HTN: new.    Patient is advised the following: Patient Instructions  you should do a "dexamethasone suppression test."  for this, you would take dexamethasone 1 mg at 10 pm, then come in for a "cortisol" blood test the next morning before 9 am.  you do not need to be fasting for this test.  Also, let's check 2 24-HR urine tests.   I would be happy to see you back here as necessary.

## 2016-03-02 ENCOUNTER — Other Ambulatory Visit: Payer: Self-pay

## 2016-03-02 ENCOUNTER — Other Ambulatory Visit (INDEPENDENT_AMBULATORY_CARE_PROVIDER_SITE_OTHER): Payer: Managed Care, Other (non HMO)

## 2016-03-02 DIAGNOSIS — E279 Disorder of adrenal gland, unspecified: Secondary | ICD-10-CM

## 2016-03-02 LAB — CORTISOL: Cortisol, Plasma: 0.6 ug/dL

## 2016-03-03 LAB — CORTISOL, URINE, 24 HOUR
Cortisol (Ur), Free: 72.8 mcg/24 h — ABNORMAL HIGH (ref 4.0–50.0)
RESULTS RECEIVED: 2.9 g/(24.h) — AB (ref 0.63–2.50)

## 2016-03-03 LAB — CATECHOLAMINES, FRACTIONATED, URINE, 24 HOUR
CALCULATED TOTAL (E+ NE): 55 ug/(24.h) (ref 26–121)
Creatinine, Urine mg/day-CATEUR: 2.45 g/(24.h) (ref 0.63–2.50)
Dopamine, 24 hr Urine: 265 mcg/24 h (ref 52–480)
EPINEPHRINE, 24 HR URINE: 15 ug/(24.h) (ref 2–24)
NOREPINEPHRINE, 24 HR UR: 40 ug/(24.h) (ref 15–100)
TOTAL VOLUME - CF 24HR U: 3000 mL

## 2016-03-05 LAB — METANEPHRINES, URINE, 24 HOUR
Metaneph Total, Ur: 335 mcg/24 h (ref 182–739)
Metanephrines, Ur: 120 mcg/24 h (ref 58–203)
Normetanephrine, 24H Ur: 215 mcg/24 h (ref 88–649)

## 2016-03-06 ENCOUNTER — Other Ambulatory Visit: Payer: Self-pay | Admitting: Endocrinology

## 2016-03-06 DIAGNOSIS — E279 Disorder of adrenal gland, unspecified: Secondary | ICD-10-CM

## 2016-03-07 ENCOUNTER — Encounter: Payer: Self-pay | Admitting: Family Medicine

## 2016-03-07 DIAGNOSIS — R202 Paresthesia of skin: Secondary | ICD-10-CM

## 2016-03-12 LAB — CORTISOL, URINE, 24 HOUR
Cortisol (Ur), Free: 72.3 mcg/24 h — ABNORMAL HIGH (ref 4.0–50.0)
RESULTS RECEIVED: 2.74 g/(24.h) — AB (ref 0.63–2.50)

## 2016-03-17 ENCOUNTER — Ambulatory Visit (INDEPENDENT_AMBULATORY_CARE_PROVIDER_SITE_OTHER): Payer: Managed Care, Other (non HMO) | Admitting: Neurology

## 2016-03-17 ENCOUNTER — Encounter: Payer: Self-pay | Admitting: Neurology

## 2016-03-17 VITALS — BP 160/90 | HR 67 | Ht 69.0 in | Wt 176.6 lb

## 2016-03-17 DIAGNOSIS — R030 Elevated blood-pressure reading, without diagnosis of hypertension: Secondary | ICD-10-CM | POA: Diagnosis not present

## 2016-03-17 DIAGNOSIS — IMO0001 Reserved for inherently not codable concepts without codable children: Secondary | ICD-10-CM

## 2016-03-17 DIAGNOSIS — F459 Somatoform disorder, unspecified: Secondary | ICD-10-CM

## 2016-03-17 DIAGNOSIS — F411 Generalized anxiety disorder: Secondary | ICD-10-CM | POA: Diagnosis not present

## 2016-03-17 DIAGNOSIS — R202 Paresthesia of skin: Secondary | ICD-10-CM | POA: Diagnosis not present

## 2016-03-17 NOTE — Patient Instructions (Signed)
1.  We will order NCS/EMG of the right arm and leg.  You will receive a call from our office regarding the results. 2.  Please explore a new hobby that you enjoy 3.  Stop online looking your symptoms online

## 2016-03-17 NOTE — Progress Notes (Signed)
[facility] Clinic Note - Initial Visit   Date: 03/17/2016  Tanner Sanchez MRN: [id number] DOB: 11-10-1970   Dear Dr. Lorelei Pont:  Thank you for your kind referral of Tanner Sanchez for consultation of generalized paresthesias. Although his history is well known to you, please allow [place] to reiterate it for the purpose of our medical record. The patient was accompanied to the clinic by self.   History of Present Illness: Tanner Sanchez is a 45 y.o. right-handed African American male s/p lumbar fusion L5-S1 presenting for evaluation of generalized paresthesias.    In January, he went to the ER because of chest pain and anxiety and was told he had a panic attack. On March 16th, he again developed lightheadedness, chest pain, and tingling of the legs and went to urgent care the following day.  He was again told that symptoms were related to a panic attack.  He went to see his PCP who recommended benzodiazepine but he did not wish to do this.  Instead, he started looking up options online and picked up 5-HT over the counter which did not help.  He continues to have constant tingling over the legs, feet, arms, hand, and fire sensation of the legs. He is also worried about discoloration of the tip of the left toe. When stressed, symptoms are worse and involve his face. He does not have any weakness.  He has started accupuntcure, tai chi, and meditation.    He stays very active and eats well.  He admits to researching symptoms online (about an hour/day recently) and is very concerned about having something wrong.  He monitors his blood pressure four times daily and noticed his blood pressure fluctuates between SBP 120-140 during the day, so became worried about pheochromocytoma.    He admits to having significant stress because they are trying to build a new home and the appraisal was coming back too low.  He works in Control and instrumentation engineer.  He is married and has two  children.  His wife and a physician friend have both told him to stop looking up his symptoms online.    Out-side paper records, electronic medical record, and images have been reviewed where available and summarized as:  Labs 12/02/2015:  HbA1c 5.0, CMP normal, CBC normal, TSH 2.39, vitamin D 31.1, vitamin B12 474  Past Medical History  Diagnosis Date  . Healthy adult     Past Surgical History  Procedure Laterality Date  . Back surgery    . Cervical lymphadenopathy       Medications:  Outpatient Encounter Prescriptions as of 03/17/2016  Medication Sig  . Cholecalciferol (VITAMIN D-3 PO) Take 5,000 Units by mouth.  . FOLIC QAST-M1-D62-IWLNLGXQ Q10 PO Take by mouth daily.  Marland Kitchen VITAMIN K PO Take 150 mcg by mouth daily.  . [DISCONTINUED] propranolol (INDERAL) 10 MG tablet Take 1 tablet (10 mg total) by mouth 3 (three) times daily. Use as needed for palpitations and other symptoms of anxiety   No facility-administered encounter medications on file as of 03/17/2016.     Allergies: No Known Allergies  Family History: Family History  Problem Relation Age of Onset  . Diabetes Paternal Grandmother   . Alcohol abuse Other   . Drug abuse Other   . Adrenal disorder Neg Hx   . Healthy Mother   . Healthy Father   . Healthy Sister   . Healthy Brother     Social History: Social History  Substance Use Topics  .  Smoking status: Never Smoker   . Smokeless tobacco: Never Used  . Alcohol Use: No   Social History   Social History Narrative   Lives with wife and 2 children in an apartment on the first floor.     Works in Press photographer (Merchandiser, retail)   Education: college.    Review of Systems:  CONSTITUTIONAL: No fevers, chills, night sweats, or weight loss.   EYES: No visual changes or eye pain ENT: No hearing changes.  No history of nose bleeds.   RESPIRATORY: No cough, wheezing and shortness of breath.   CARDIOVASCULAR: Negative for chest pain, and palpitations.   GI:  Negative for abdominal discomfort, blood in stools or black stools.  No recent change in bowel habits.   GU:  No history of incontinence.   MUSCLOSKELETAL: No history of joint pain or swelling.  No myalgias.   SKIN: Negative for lesions, rash, and itching.   HEMATOLOGY/ONCOLOGY: Negative for prolonged bleeding, bruising easily, and swollen nodes.  No history of cancer.   ENDOCRINE: Negative for cold or heat intolerance, polydipsia or goiter.   PSYCH:  No depression +anxiety symptoms.   NEURO: As Above.   Vital Signs:  BP 160/90 mmHg  Pulse 67  Ht _0  (1.753 m)  Wt 176 lb 9 oz (80.088 kg)  BMI 26.06 kg/m2  SpO2 100%   General Medical Exam:   General:  Well appearing, comfortable.   Eyes/ENT: see cranial nerve examination.   Neck: No masses appreciated.  Full range of motion without tenderness.  No carotid bruits. Respiratory:  Clear to auscultation, good air entry bilaterally.   Cardiac:  Regular rate and rhythm, no murmur.   Extremities:  No deformities, edema, or skin discoloration.  Skin:  No rashes or lesions.  Neurological Exam: MENTAL STATUS including orientation to time, place, person, recent and remote memory, attention span and concentration, language, and fund of knowledge is normal.  Speech is not dysarthric.  CRANIAL NERVES: II:  No visual field defects.  Unremarkable fundi.   III-IV-VI: Pupils equal round and reactive to light.  Normal conjugate, extra-ocular eye movements in all directions of gaze.  No nystagmus.  No ptosis.   V:  Normal facial sensation.    VII:  Normal facial symmetry and movements.  No pathologic facial reflexes.  VIII:  Normal hearing and vestibular function.   IX-X:  Normal palatal movement.   XI:  Normal shoulder shrug and head rotation.   XII:  Normal tongue strength and range of motion, no deviation or fasciculation.  MOTOR:  No atrophy, fasciculations or abnormal movements.  No pronator drift.  Tone is normal.    Right Upper  Extremity:    Left Upper Extremity:    Deltoid  5/5   Deltoid  5/5   Biceps  5/5   Biceps  5/5   Triceps  5/5   Triceps  5/5   Wrist extensors  5/5   Wrist extensors  5/5   Wrist flexors  5/5   Wrist flexors  5/5   Finger extensors  5/5   Finger extensors  5/5   Finger flexors  5/5   Finger flexors  5/5   Dorsal interossei  5/5   Dorsal interossei  5/5   Abductor pollicis  5/5   Abductor pollicis  5/5   Tone (Ashworth scale)  0  Tone (Ashworth scale)  0   Right Lower Extremity:    Left Lower Extremity:    Hip flexors  5/5  Hip flexors  5/5   Hip extensors  5/5   Hip extensors  5/5   Knee flexors  5/5   Knee flexors  5/5   Knee extensors  5/5   Knee extensors  5/5   Dorsiflexors  5/5   Dorsiflexors  5/5   Plantarflexors  5/5   Plantarflexors  5/5   Toe extensors  5/5   Toe extensors  5/5   Toe flexors  5/5   Toe flexors  5/5   Tone (Ashworth scale)  0  Tone (Ashworth scale)  0   MSRs:  Right                                                                 Left brachioradialis 2+  brachioradialis 2+  biceps 2+  biceps 2+  triceps 2+  triceps 2+  patellar 2+  patellar 2+  ankle jerk 2+  ankle jerk 2+  Hoffman no  Hoffman no  plantar response down  plantar response down   SENSORY:  Normal and symmetric perception of light touch, pinprick, vibration, and proprioception.  Romberg's sign absent.   COORDINATION/GAIT: Normal finger-to- nose-finger and heel-to-shin.  Intact rapid alternating movements bilaterally.  Able to rise from a chair without using arms.  Gait narrow based and stable. Tandem and stressed gait intact.    IMPRESSION: Tanner Sanchez is a pleasant 45 year-old gentleman referred for evaluation of generalized paresthesias.  Exam is entirely normal and non-focal.  Reassured the patient that my suspicion for anything worrisome is very low and symptoms are most likely a manifestation of stress reaction.  He is requesting testing specifically looking at the health of his nerves  and I explained that with a normal exam, the chances of findings anything abnormal on electrodiagnostic testing is very low.  Nevertheless, he would like to be reassured and wants to go ahead with the testing, so I will order NCS/EMG of the right arm and leg.  There is certainly evidence of somatic preoccupation and advised him to stop researching his symptoms online and allow his providers to diagnose his symptoms instead.  He should continue seeing a behavior therapist for anxiety.   The duration of this appointment visit was 50 minutes of face-to-face time with the patient.  Greater than 50% of this time was spent in counseling, explanation of diagnosis, planning of further management, and coordination of care.   Thank you for allowing me to participate in patient's care.  If I can answer any additional questions, I would be pleased to do so.    Sincerely,    Donika K. Posey Pronto, DO

## 2016-03-21 ENCOUNTER — Encounter: Payer: Managed Care, Other (non HMO) | Admitting: Neurology

## 2016-04-04 ENCOUNTER — Encounter: Payer: Managed Care, Other (non HMO) | Admitting: Neurology

## 2016-04-04 ENCOUNTER — Ambulatory Visit (INDEPENDENT_AMBULATORY_CARE_PROVIDER_SITE_OTHER): Payer: Managed Care, Other (non HMO) | Admitting: Neurology

## 2016-04-04 DIAGNOSIS — IMO0001 Reserved for inherently not codable concepts without codable children: Secondary | ICD-10-CM

## 2016-04-04 DIAGNOSIS — R030 Elevated blood-pressure reading, without diagnosis of hypertension: Secondary | ICD-10-CM | POA: Diagnosis not present

## 2016-04-04 DIAGNOSIS — F411 Generalized anxiety disorder: Secondary | ICD-10-CM

## 2016-04-04 DIAGNOSIS — R202 Paresthesia of skin: Secondary | ICD-10-CM

## 2016-04-04 NOTE — Procedures (Signed)
[facility]  [address], [address]  [place], [place] [address] Tel: [phone] Fax:  [phone] Test Date:  04/04/2016  Patient: Tanner SlateJoe Sanchez DOB: 02/04/1971 Physician: Nita Sickleonika Patel, DO  Sex: Male Height: 5\' 9"  Ref Phys: Nita Sickleonika Patel, DO  ID#: [id number] Temp: 32.3C Technician: Judie PetitM. Dean   Patient Complaints: This is a 45 year old gentleman referred for evaluation of numbness and tingling involving the arms and legs.  NCV & EMG Findings: Extensive electrodiagnostic testing of the right upper and lower extremity shows: 1. All sensory responses including the right median, ulnar, mixed palmar, sural and superficial peroneal sensory responses are within normal limits. 2. All motor responses including the right median, ulnar, peroneal, and tibial nerves are within normal limits. 3. Bilateral tibial H reflex studies are within normal limits. 4. There is no evidence of active or chronic motor axon loss changes affecting any of the tested muscles. Motor unit configuration and recruitment pattern is within normal limits.  Impression: This is a normal study of the right upper and lower extremities. In particular, there is no evidence of a generalized sensorimotor polyneuropathy, cervical/lumbosacral radiculopathy, diffuse myopathy, or carpal tunnel syndrome.   ___________________________ Nita Sickleonika Patel, DO    Nerve Conduction Studies Anti Sensory Summary Table   Stim Site NR Peak (ms) Norm Peak (ms) P-T Amp (V) Norm P-T Amp  Right Median Anti Sensory (2nd Digit)  32.3C  Wrist    3.0 <3.4 42.7 >20  Right Sup Peroneal Anti Sensory (Ant Lat Mall)  32.3C  12 cm    3.1 <4.5 11.4 >5  Right Sural Anti Sensory (Lat Mall)  32.3C  Calf    2.8 <4.5 13.0 >5  Right Ulnar Anti Sensory (5th Digit)  32.3C  Wrist    3.0 <3.1 42.5 >12   Motor Summary Table   Stim Site NR Onset (ms) Norm Onset (ms) O-P Amp (mV) Norm O-P Amp Site1 Site2 Delta-0 (ms) Dist (cm) Vel (m/s) Norm Vel (m/s)    Right Median Motor (Abd Poll Brev)  32.3C  Wrist    3.0 <3.9 11.9 >6 Elbow Wrist 4.3 24.0 56 >50  Elbow    7.3  11.1         Right Peroneal Motor (Ext Dig Brev)  32.3C  Ankle    3.8 <5.5 12.2 >3 B Fib Ankle 6.7 32.0 48 >40  B Fib    10.5  11.1  Poplt B Fib 1.8 10.0 56 >40  Poplt    12.3  10.7         Right Tibial Motor (Abd Hall Brev)  32.3C  Ankle    4.3 <6.0 8.8 >8 Knee Ankle 7.9 40.0 51 >40  Knee    12.2  8.1         Right Ulnar Motor (Abd Dig Minimi)  32.3C  Wrist    2.4 <3.1 10.8 >7 B Elbow Wrist 3.7 23.0 62 >50  B Elbow    6.1  9.6  A Elbow B Elbow 1.8 10.0 56 >50  A Elbow    7.9  9.0          Comparison Summary Table   Stim Site NR Peak (ms) Norm Peak (ms) P-T Amp (V) Site1 Site2 Delta-P (ms) Norm Delta (ms)  Right Median/Ulnar Palm Comparison (Wrist - 8cm)  32.3C  Median Palm    1.7 <2.2 140.0 Median Palm Ulnar Palm 0.2   Ulnar Palm    1.5 <2.2 39.4  H Reflex Studies   NR H-Lat (ms) Lat Norm (ms) L-R H-Lat (ms) M-Lat (ms) HLat-MLat (ms)  Left Tibial (Gastroc)  32.3C     33.74 <35 0.14 3.40 30.34  Right Tibial (Gastroc)  32.3C     33.61 <35 0.14 3.40 30.21   EMG   Side Muscle Ins Act Fibs Psw Fasc Number Recrt Dur Dur. Amp Amp. Poly Poly. Comment  Right AntTibialis Nml Nml Nml Nml Nml Nml Nml Nml Nml Nml Nml Nml N/A  Right 1stDorInt Nml Nml Nml Nml Nml Nml Nml Nml Nml Nml Nml Nml N/A  Right Ext Indicis Nml Nml Nml Nml Nml Nml Nml Nml Nml Nml Nml Nml N/A  Right PronatorTeres Nml Nml Nml Nml Nml Nml Nml Nml Nml Nml Nml Nml N/A  Right Biceps Nml Nml Nml Nml Nml Nml Nml Nml Nml Nml Nml Nml N/A  Right Triceps Nml Nml Nml Nml Nml Nml Nml Nml Nml Nml Nml Nml N/A  Right Deltoid Nml Nml Nml Nml Nml Nml Nml Nml Nml Nml Nml Nml N/A  Right Gastroc Nml Nml Nml Nml Nml Nml Nml Nml Nml Nml Nml Nml N/A  Right Flex Dig Long Nml Nml Nml Nml Nml Nml Nml Nml Nml Nml Nml Nml N/A  Right RectFemoris Nml Nml Nml Nml Nml Nml Nml Nml Nml Nml Nml Nml N/A  Right GluteusMed Nml  Nml Nml Nml Nml Nml Nml Nml Nml Nml Nml Nml N/A    Waveforms:

## 2016-04-20 ENCOUNTER — Ambulatory Visit: Payer: Managed Care, Other (non HMO) | Admitting: Neurology

## 2016-07-14 IMAGING — DX DG CHEST 2V
2 series · 2 of 2 positions shown · non-contrast
Comparison: Chest x-ray dated 04/06/2014.

CLINICAL DATA: Left shoulder tightness onset today. Bilateral
tingling to feet yesterday.

EXAM:
CHEST  2 VIEW

[chest pa]
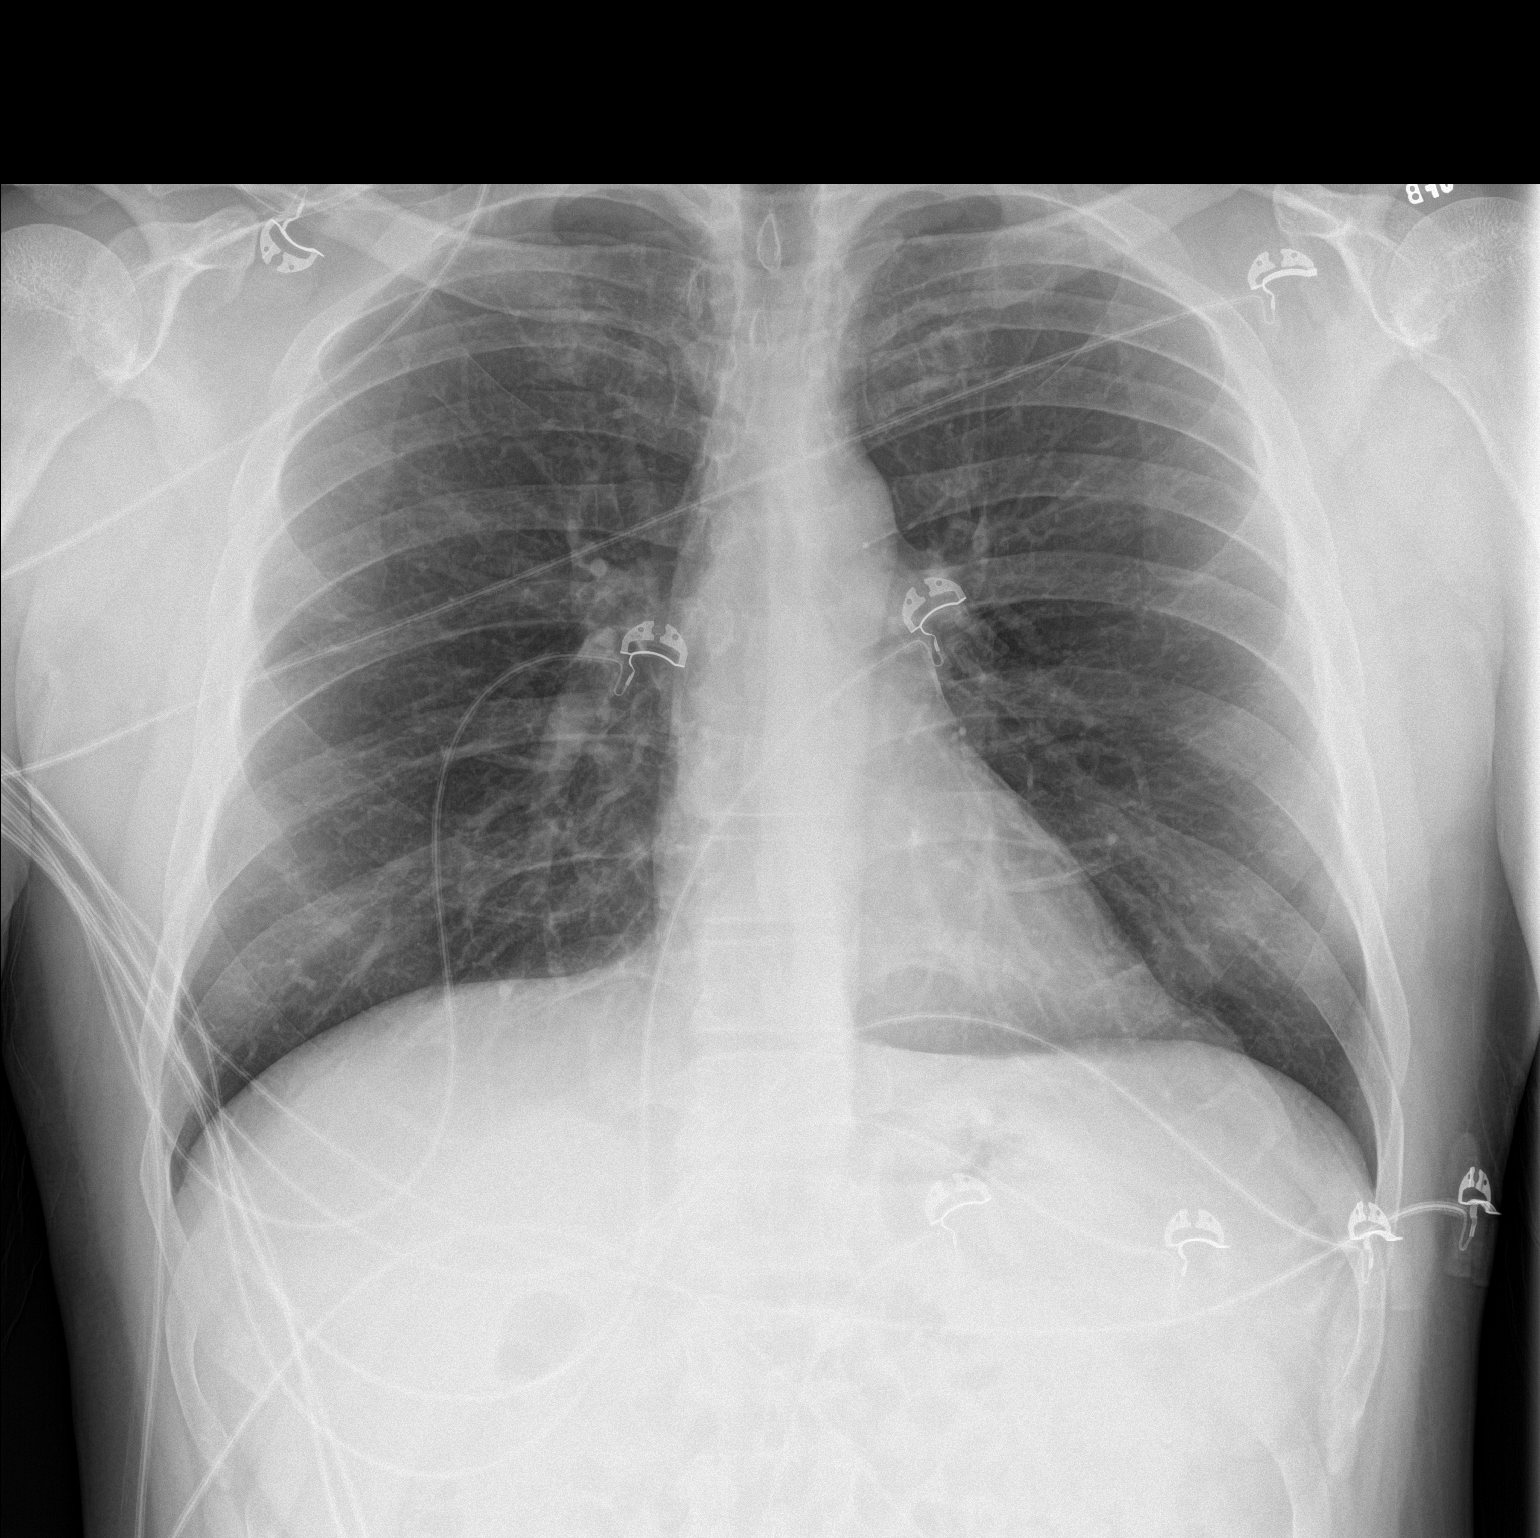

[chest lat]
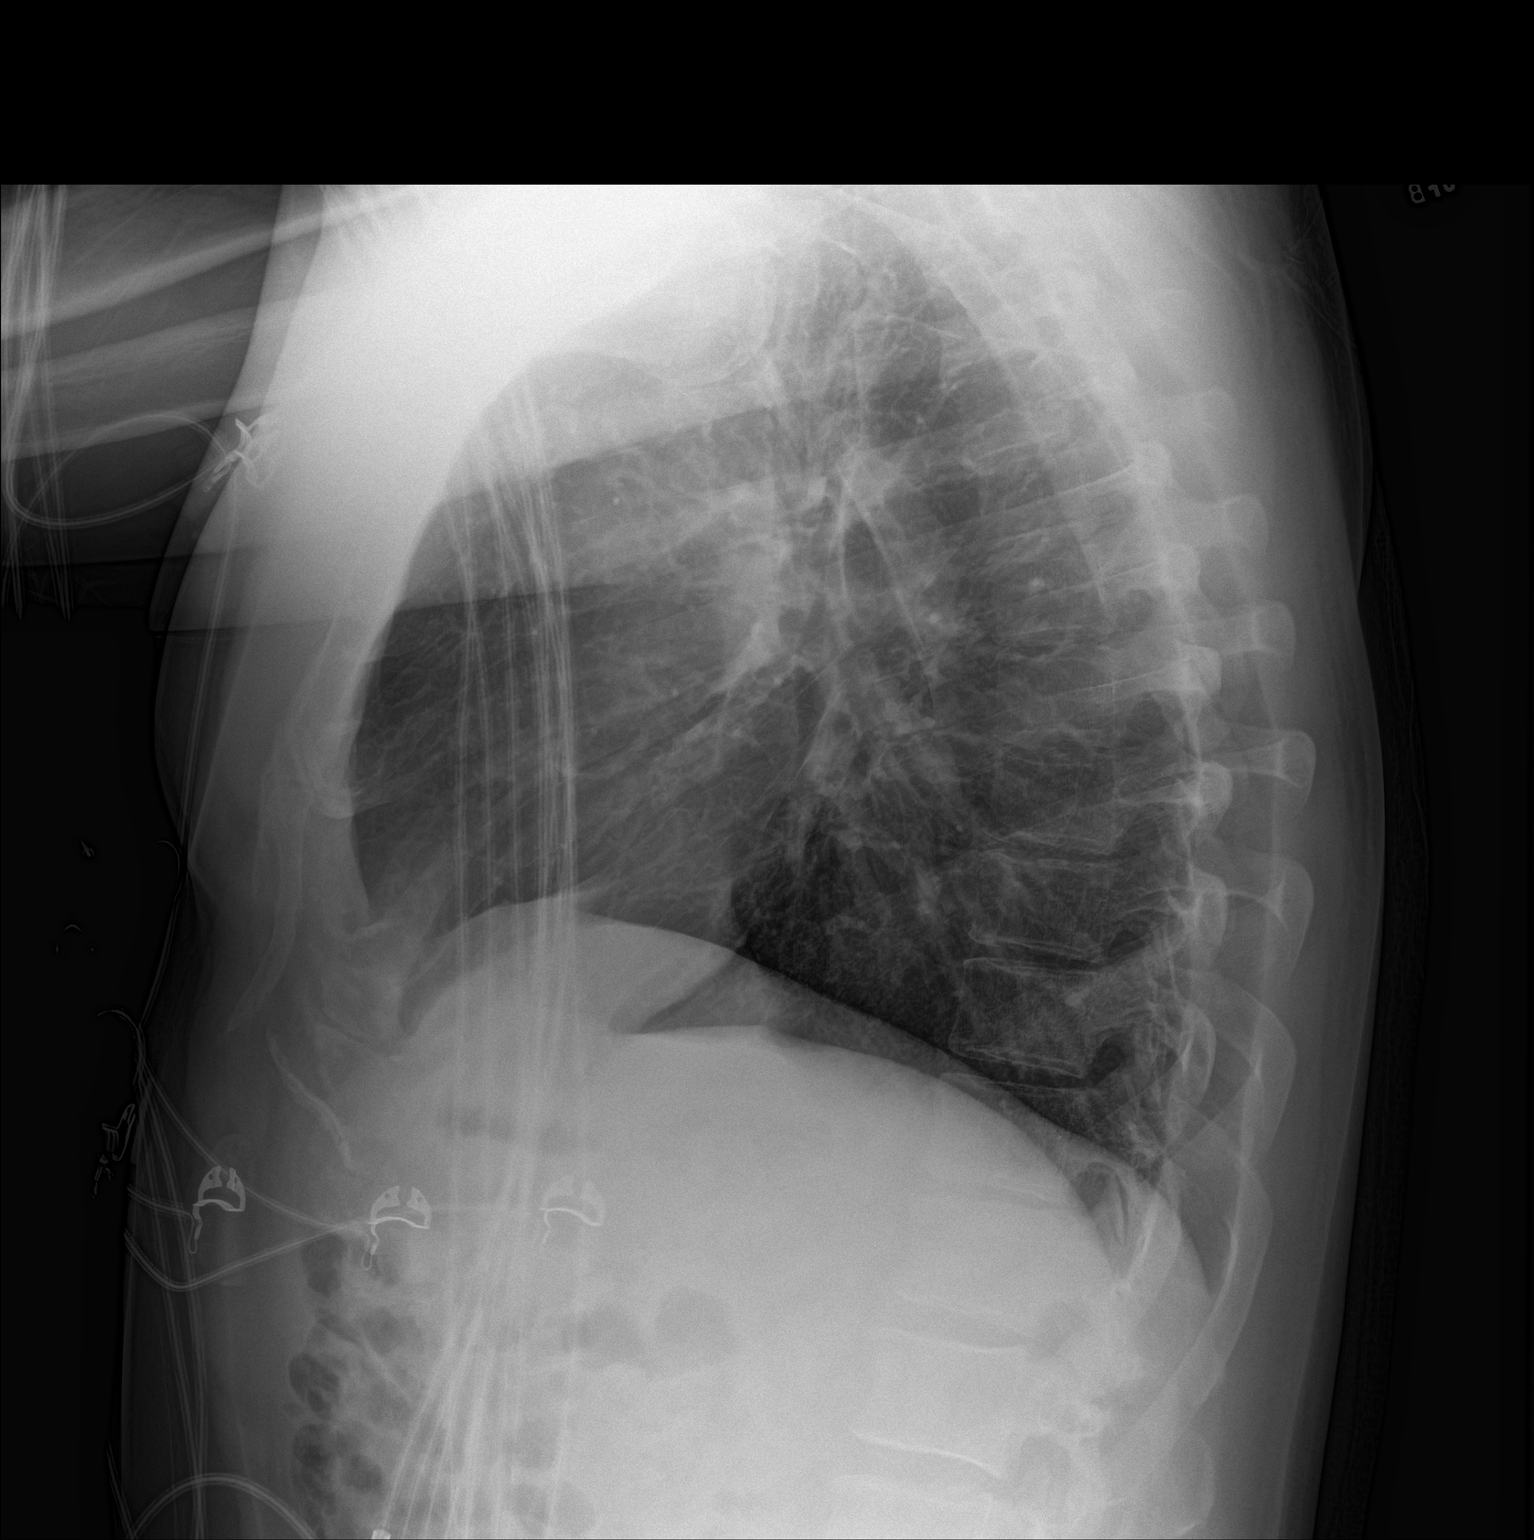

[2 of 2 positions shown; findings below may reference images not displayed]

FINDINGS: The heart size and mediastinal contours are within normal limits.
Both lungs are clear. The visualized skeletal structures are
unremarkable.
IMPRESSION: Stable chest x-ray. No evidence of acute cardiopulmonary
abnormality.

## 2016-07-14 IMAGING — DX DG SHOULDER 2+V*L*
3 series · 3 of 3 positions shown · non-contrast
Comparison: None.

CLINICAL DATA: Left-sided shoulders tightness onset today.

EXAM:
LEFT SHOULDER - 2+ VIEW

[shoulder y view]
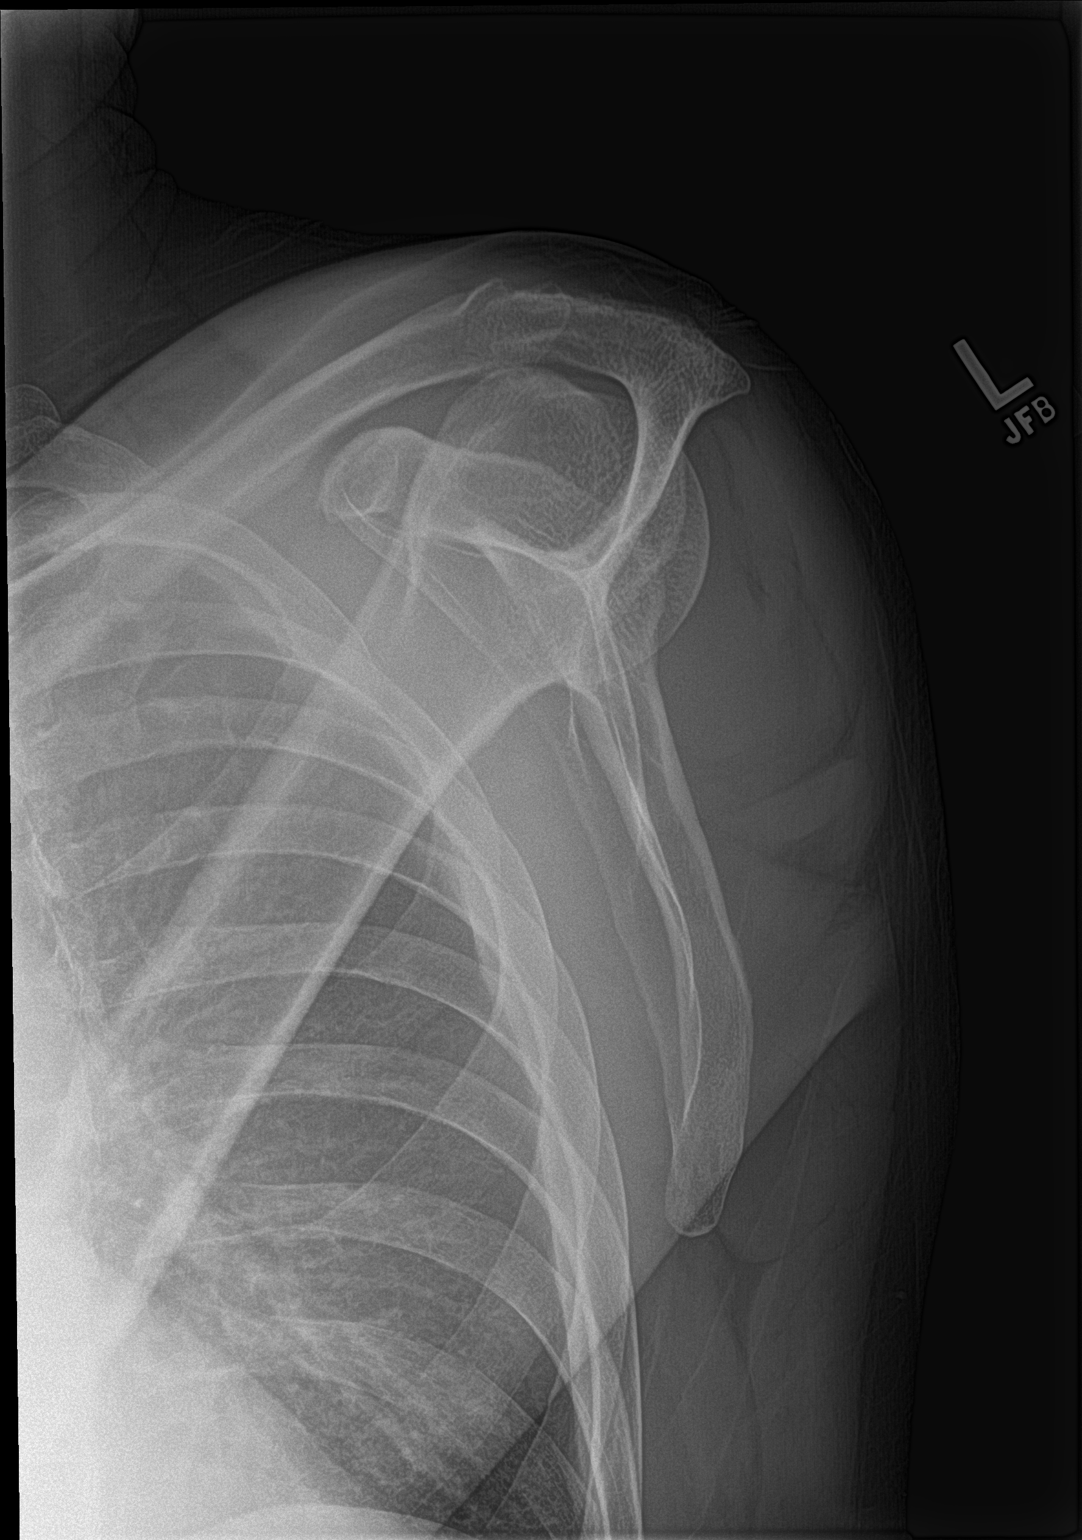

[shoulder axillary]
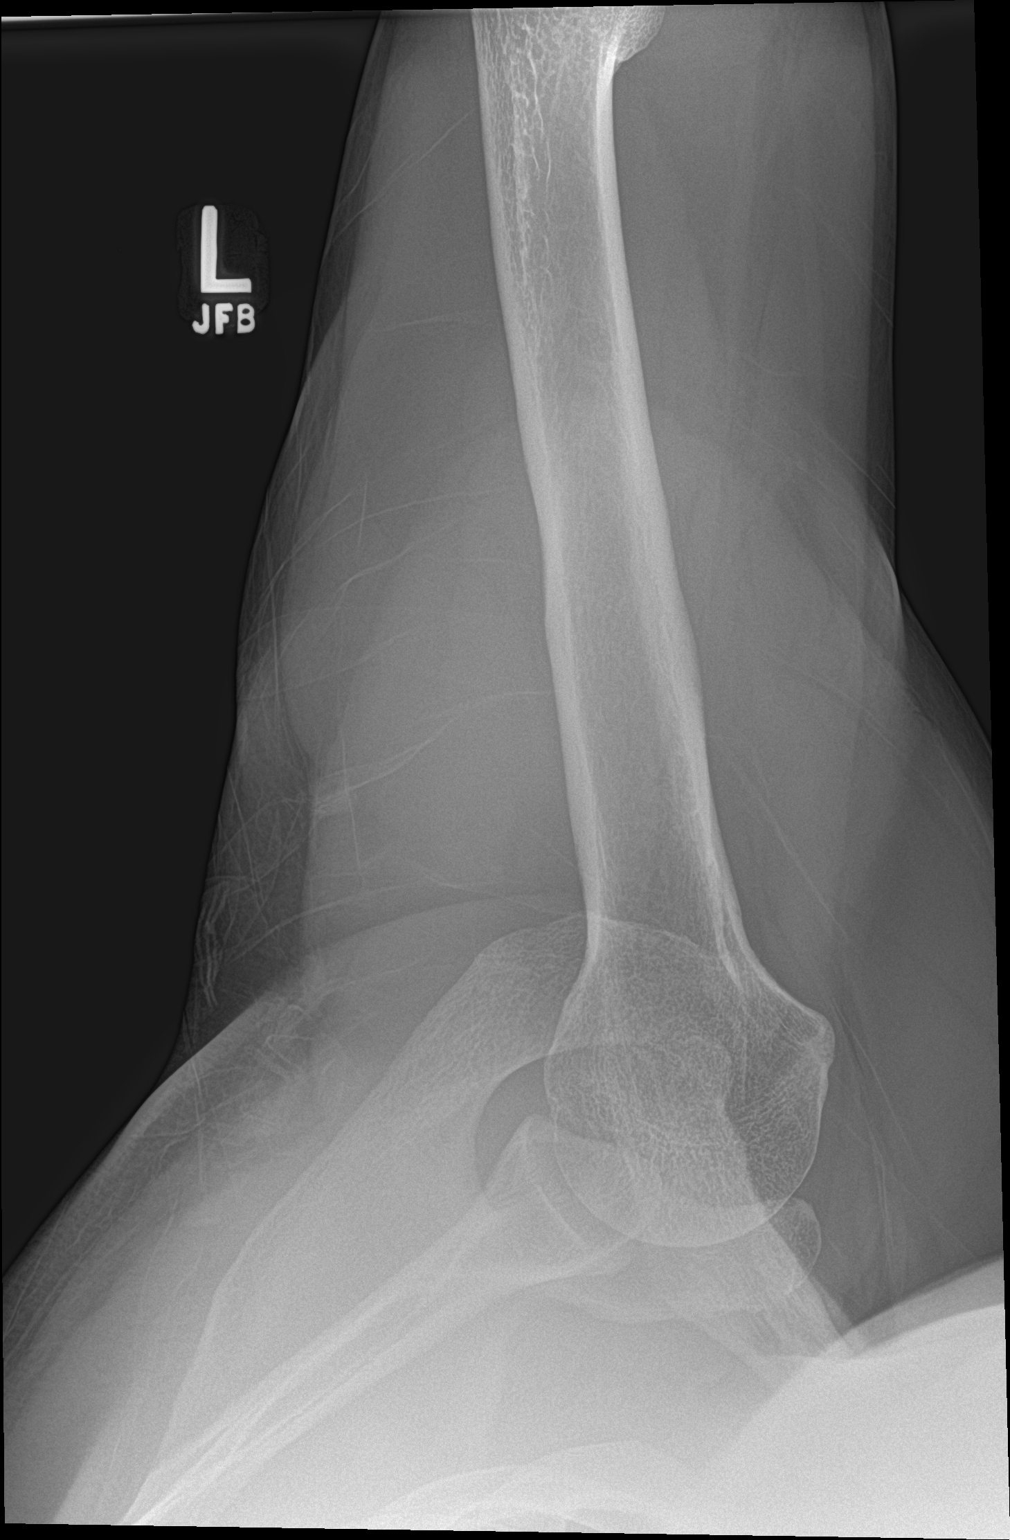

[shoulder grashey]
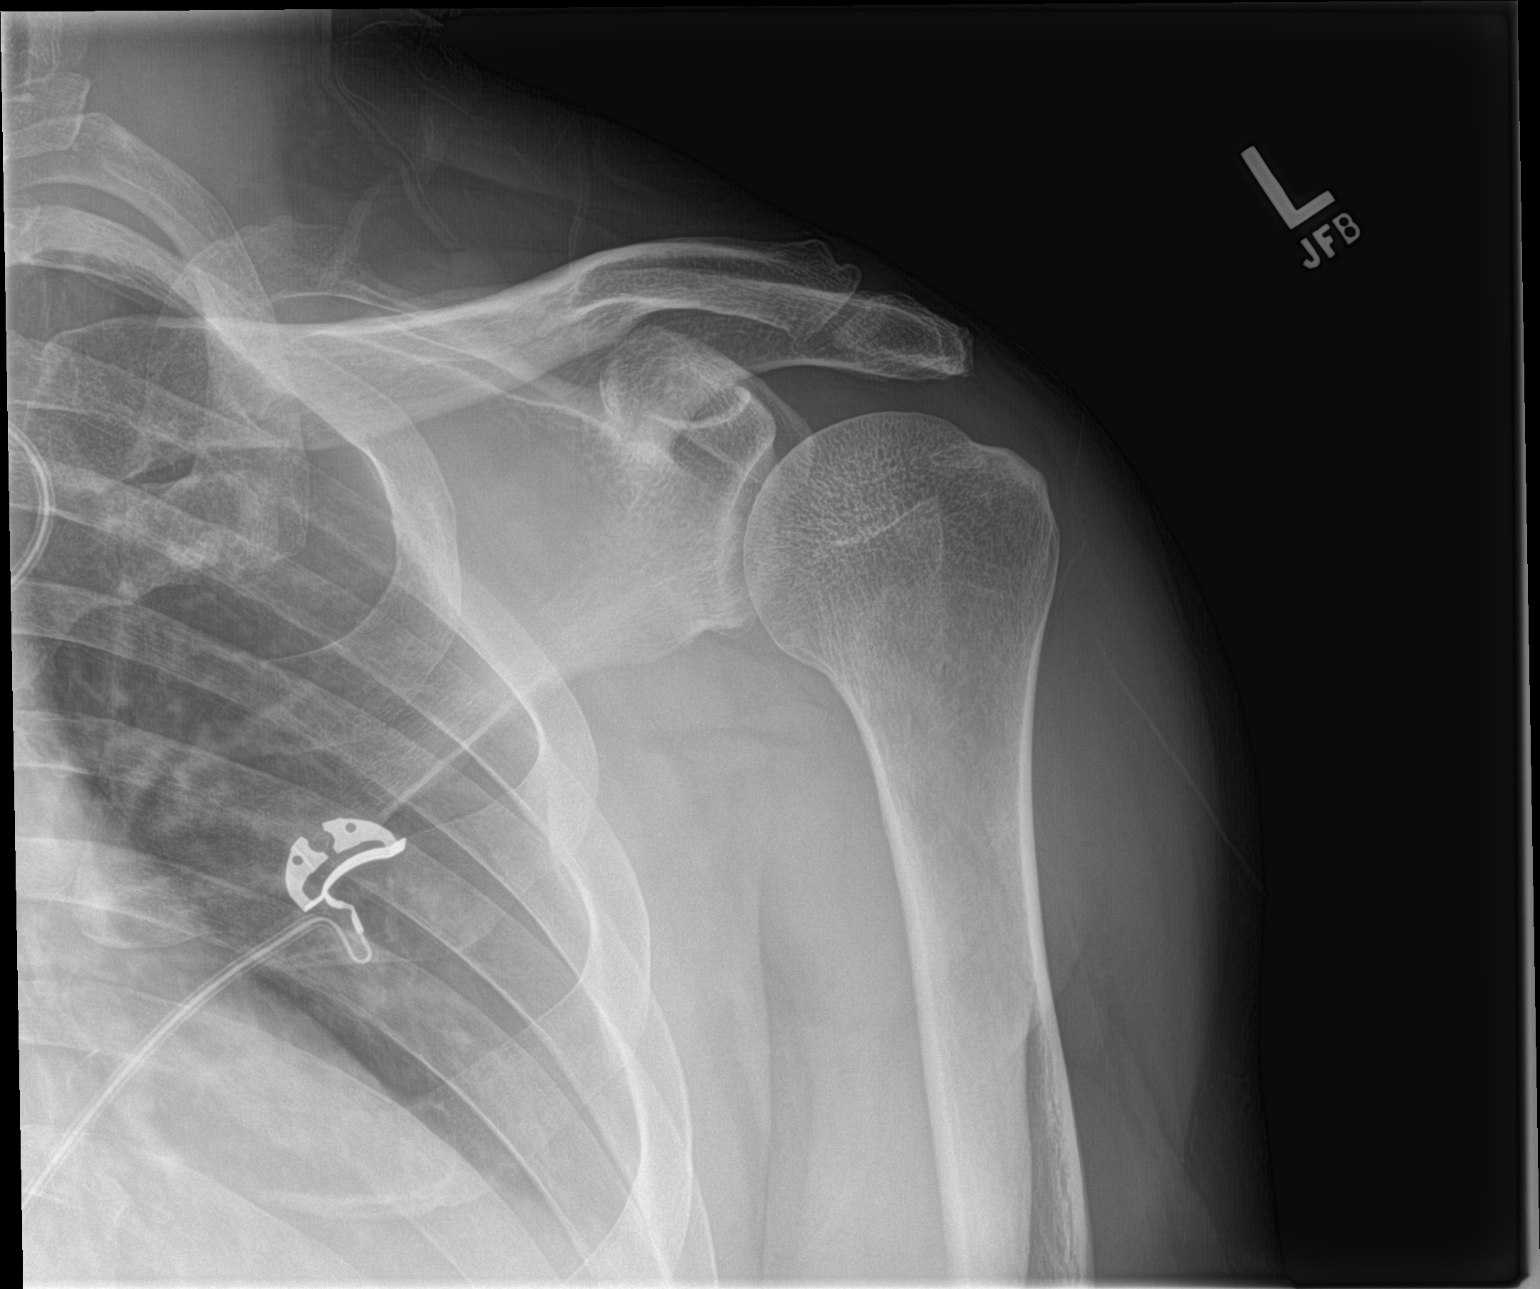

[3 of 3 positions shown; findings below may reference images not displayed]

FINDINGS: There is no evidence of fracture or dislocation. Bone mineralization
is normal. No focal cortical irregularity or osseous lesion. There
is no evidence of arthropathy. Adjacent soft tissues are
unremarkable.
IMPRESSION: Negative.
